# Patient Record
Sex: Female | Born: 1995 | Race: Black or African American | Hispanic: No | Marital: Single | Smoking: Never smoker
Health system: Southern US, Community
[De-identification: ages and names within clinical notes are randomized; demographics above are authoritative.]

---

## 2017-03-22 ENCOUNTER — Emergency Department (HOSPITAL_COMMUNITY)
Admission: EM | Admit: 2017-03-22 | Discharge: 2017-03-22 | Disposition: A | Discharge status: Home / Self Care | Payer: 59 | Attending: Emergency Medicine | Admitting: Emergency Medicine

## 2017-03-22 ENCOUNTER — Other Ambulatory Visit: Payer: Self-pay

## 2017-03-22 ENCOUNTER — Encounter (HOSPITAL_COMMUNITY): Payer: Self-pay | Admitting: Emergency Medicine

## 2017-03-22 DIAGNOSIS — R1084 Generalized abdominal pain: Secondary | ICD-10-CM | POA: Insufficient documentation

## 2017-03-22 DIAGNOSIS — Z5321 Procedure and treatment not carried out due to patient leaving prior to being seen by health care provider: Secondary | ICD-10-CM | POA: Diagnosis not present

## 2017-03-22 LAB — COMPREHENSIVE METABOLIC PANEL
ALK PHOS: 62 U/L (ref 38–126)
ALT: 13 U/L — AB (ref 14–54)
AST: 19 U/L (ref 15–41)
Albumin: 4 g/dL (ref 3.5–5.0)
Anion gap: 6 (ref 5–15)
BILIRUBIN TOTAL: 0.8 mg/dL (ref 0.3–1.2)
BUN: 12 mg/dL (ref 6–20)
CALCIUM: 9.6 mg/dL (ref 8.9–10.3)
CHLORIDE: 105 mmol/L (ref 101–111)
CO2: 25 mmol/L (ref 22–32)
CREATININE: 0.87 mg/dL (ref 0.44–1.00)
Glucose, Bld: 81 mg/dL (ref 65–99)
Potassium: 4.7 mmol/L (ref 3.5–5.1)
Sodium: 136 mmol/L (ref 135–145)
Total Protein: 6.9 g/dL (ref 6.5–8.1)

## 2017-03-22 LAB — CBC
HCT: 40.9 % (ref 36.0–46.0)
Hemoglobin: 14.4 g/dL (ref 12.0–15.0)
MCH: 32 pg (ref 26.0–34.0)
MCHC: 35.2 g/dL (ref 30.0–36.0)
MCV: 90.9 fL (ref 78.0–100.0)
PLATELETS: 293 10*3/uL (ref 150–400)
RBC: 4.5 MIL/uL (ref 3.87–5.11)
RDW: 13.1 % (ref 11.5–15.5)
WBC: 5.2 10*3/uL (ref 4.0–10.5)

## 2017-03-22 LAB — URINALYSIS, ROUTINE W REFLEX MICROSCOPIC
Bilirubin Urine: NEGATIVE
GLUCOSE, UA: NEGATIVE mg/dL
Hgb urine dipstick: NEGATIVE
Ketones, ur: 5 mg/dL — AB
LEUKOCYTES UA: NEGATIVE
Nitrite: POSITIVE — AB
PH: 7 (ref 5.0–8.0)
Protein, ur: NEGATIVE mg/dL
SPECIFIC GRAVITY, URINE: 1.017 (ref 1.005–1.030)

## 2017-03-22 LAB — LIPASE, BLOOD: LIPASE: 30 U/L (ref 11–51)

## 2017-03-22 LAB — I-STAT BETA HCG BLOOD, ED (MC, WL, AP ONLY): I-stat hCG, quantitative: <5 m[IU]/mL (ref <5)

## 2017-03-22 NOTE — ED Triage Notes (Signed)
Pt to ER for evaluation of persistent generalized abdominal pain with loss of appetite and weight loss, states has lost "3 jean sizes." states this has been going on for months. Reports night sweats. VSS. NAD.

## 2017-04-29 ENCOUNTER — Encounter (HOSPITAL_COMMUNITY): Payer: Self-pay | Admitting: Emergency Medicine

## 2017-04-29 DIAGNOSIS — R1012 Left upper quadrant pain: Secondary | ICD-10-CM | POA: Diagnosis not present

## 2017-04-29 DIAGNOSIS — K5900 Constipation, unspecified: Secondary | ICD-10-CM | POA: Diagnosis not present

## 2017-04-29 LAB — COMPREHENSIVE METABOLIC PANEL
ALBUMIN: 3.8 g/dL (ref 3.5–5.0)
ALK PHOS: 61 U/L (ref 38–126)
ALT: 38 U/L (ref 14–54)
ANION GAP: 10 (ref 5–15)
AST: 30 U/L (ref 15–41)
BUN: 11 mg/dL (ref 6–20)
CALCIUM: 9.5 mg/dL (ref 8.9–10.3)
CO2: 22 mmol/L (ref 22–32)
CREATININE: 0.82 mg/dL (ref 0.44–1.00)
Chloride: 105 mmol/L (ref 101–111)
GFR calc Af Amer: >60 mL/min (ref >60)
GFR calc non Af Amer: >60 mL/min (ref >60)
GLUCOSE: 129 mg/dL — AB (ref 65–99)
Potassium: 4.1 mmol/L (ref 3.5–5.1)
SODIUM: 137 mmol/L (ref 135–145)
Total Bilirubin: 0.6 mg/dL (ref 0.3–1.2)
Total Protein: 6.7 g/dL (ref 6.5–8.1)

## 2017-04-29 LAB — CBC
HCT: 39.7 % (ref 36.0–46.0)
HEMOGLOBIN: 13.6 g/dL (ref 12.0–15.0)
MCH: 30.1 pg (ref 26.0–34.0)
MCHC: 34.3 g/dL (ref 30.0–36.0)
MCV: 87.8 fL (ref 78.0–100.0)
Platelets: 285 10*3/uL (ref 150–400)
RBC: 4.52 MIL/uL (ref 3.87–5.11)
RDW: 12.5 % (ref 11.5–15.5)
WBC: 5.5 10*3/uL (ref 4.0–10.5)

## 2017-04-29 LAB — LIPASE, BLOOD: Lipase: 33 U/L (ref 11–51)

## 2017-04-29 LAB — I-STAT BETA HCG BLOOD, ED (MC, WL, AP ONLY)

## 2017-04-29 NOTE — ED Triage Notes (Signed)
Pt to ED c/o LUQ pain x 2 days - recent dx of mono 3 weeks ago and was told to go to ED for any abdominal pain, denies trauma or contact sports. No fevers or N/V. Tenderness to LUQ noted.

## 2017-04-30 ENCOUNTER — Emergency Department (HOSPITAL_COMMUNITY): Payer: 59

## 2017-04-30 ENCOUNTER — Emergency Department (HOSPITAL_COMMUNITY)
Admission: EM | Admit: 2017-04-30 | Discharge: 2017-04-30 | Disposition: A | Discharge status: Home / Self Care | Payer: 59 | Attending: Emergency Medicine | Admitting: Emergency Medicine

## 2017-04-30 DIAGNOSIS — K5900 Constipation, unspecified: Secondary | ICD-10-CM

## 2017-04-30 DIAGNOSIS — R1012 Left upper quadrant pain: Secondary | ICD-10-CM

## 2017-04-30 LAB — URINALYSIS, ROUTINE W REFLEX MICROSCOPIC
BILIRUBIN URINE: NEGATIVE
Glucose, UA: NEGATIVE mg/dL
HGB URINE DIPSTICK: NEGATIVE
KETONES UR: NEGATIVE mg/dL
Leukocytes, UA: NEGATIVE
NITRITE: NEGATIVE
Protein, ur: NEGATIVE mg/dL
Specific Gravity, Urine: 1.021 (ref 1.005–1.030)
pH: 7 (ref 5.0–8.0)

## 2017-04-30 MED ORDER — IOPAMIDOL (ISOVUE-300) INJECTION 61%
INTRAVENOUS | Status: AC
  Administered 2017-04-30: 100 mL
  Filled 2017-04-30: qty 100

## 2017-04-30 MED ORDER — IOPAMIDOL (ISOVUE-300) INJECTION 61%
INTRAVENOUS | Status: AC
  Filled 2017-04-30: qty 100

## 2017-04-30 NOTE — ED Notes (Signed)
Patient transported to CT 

## 2017-04-30 NOTE — Discharge Instructions (Signed)
Your CT scan shows your spleen is fine, it is not enlarged and is functioning fine. You do have a lot of stool in your colon which may be causes your pain. Get miralax and put one dose or 17 g in 8 ounces of water,  take 1 dose every 30 minutes for 2-3 hours or until you  get good results and then once or twice daily to prevent constipation.

## 2017-04-30 NOTE — ED Provider Notes (Signed)
[facility] Provider Note   CSN: [id number] Arrival date & time: 04/29/17  1744  Time seen 2:25 AM   History   Chief Complaint Chief Complaint  Patient presents with  . Abdominal Pain    HPI Ann Tucker is a 22 y.o. female.  HPI patient states she was diagnosed with mononucleosis about 3 weeks ago.  At that time she had a sore throat, swollen lymph nodes in her neck, fatigue and nausea.  She states her nausea has improved however she still gets tired at times.  She started getting left upper quadrant pain On Friday, February 16.  She describes it as sharp in it hurts if she presses on her abdomen and it will last a few minutes.  But sometimes it comes even without her pressing on her abdomen.  She denies any fever, vomiting, and has had some diarrhea off and on.  She also states her abdominal pain has gotten worse while she was waiting in the ED waiting room which was many hours.  PCP none   History reviewed. No pertinent past medical history.  There are no active problems to display for this patient.   History reviewed. No pertinent surgical history.  OB History    No data available       Home Medications    Prior to Admission medications   Not on File    Family History No family history on file.  Social History Social History   Tobacco Use  . Smoking status: Never Smoker  . Smokeless tobacco: Never Used  Substance Use Topics  . Alcohol use: Yes    Frequency: Never    Comment: occ  . Drug use: No  college student at [employer]   Allergies   Patient has no known allergies.   Review of Systems Review of Systems  All other systems reviewed and are negative.    Physical Exam Updated Vital Signs BP (!) 132/98 (BP Location: Right Arm)   Pulse 80   Temp 99.2 F (37.3 C) (Oral)   Resp 16   Ht 5\' 2"  (1.575 m)   Wt 61.2 kg (135 lb)   LMP 04/15/2017 (Exact Date)   SpO2 100%   BMI 24.69 kg/m    Vital signs normal    Physical Exam  Constitutional: She is oriented to person, place, and time. She appears well-developed and well-nourished.  Non-toxic appearance. She does not appear ill. No distress.  HENT:  Head: Normocephalic and atraumatic.  Right Ear: External ear normal.  Left Ear: External ear normal.  Nose: Nose normal. No mucosal edema or rhinorrhea.  Mouth/Throat: Oropharynx is clear and moist and mucous membranes are normal. No dental abscesses or uvula swelling.  Eyes: Conjunctivae and EOM are normal. Pupils are equal, round, and reactive to light.  Neck: Normal range of motion and full passive range of motion without pain. Neck supple.  Cardiovascular: Normal rate, regular rhythm and normal heart sounds. Exam reveals no gallop and no friction rub.  No murmur heard. Pulmonary/Chest: Effort normal and breath sounds normal. No respiratory distress. She has no wheezes. She has no rhonchi. She has no rales. She exhibits no tenderness and no crepitus.  Abdominal: Soft. Normal appearance and bowel sounds are normal. She exhibits no distension and no mass. There is tenderness in the left upper quadrant. There is no rebound and no guarding.    Musculoskeletal: Normal range of motion. She exhibits no edema or tenderness.  Moves  all extremities well.   Lymphadenopathy:    She has cervical adenopathy.  She has small cervical lymphadenopathy mainly on the left  Neurological: She is alert and oriented to person, place, and time. She has normal strength. No cranial nerve deficit.  Skin: Skin is warm, dry and intact. No rash noted. No erythema. No pallor.  Psychiatric: She has a normal mood and affect. Her speech is normal and behavior is normal. Her mood appears not anxious.  Nursing note and vitals reviewed.    ED Treatments / Results  Labs (all labs ordered are listed, but only abnormal results are displayed) Results for orders placed or performed during the hospital  encounter of 04/30/17  Lipase, blood  Result Value Ref Range   Lipase 33 11 - 51 U/L  Comprehensive metabolic panel  Result Value Ref Range   Sodium 137 135 - 145 mmol/L   Potassium 4.1 3.5 - 5.1 mmol/L   Chloride 105 101 - 111 mmol/L   CO2 22 22 - 32 mmol/L   Glucose, Bld 129 (H) 65 - 99 mg/dL   BUN 11 6 - 20 mg/dL   Creatinine, Ser 6.57 0.44 - 1.00 mg/dL   Calcium 9.5 8.9 - 84.6 mg/dL   Total Protein 6.7 6.5 - 8.1 g/dL   Albumin 3.8 3.5 - 5.0 g/dL   AST 30 15 - 41 U/L   ALT 38 14 - 54 U/L   Alkaline Phosphatase 61 38 - 126 U/L   Total Bilirubin 0.6 0.3 - 1.2 mg/dL   GFR calc non Af Amer >60 >60 mL/min   GFR calc Af Amer >60 >60 mL/min   Anion gap 10 5 - 15  CBC  Result Value Ref Range   WBC 5.5 4.0 - 10.5 K/uL   RBC 4.52 3.87 - 5.11 MIL/uL   Hemoglobin 13.6 12.0 - 15.0 g/dL   HCT 96.2 95.2 - 84.1 %   MCV 87.8 78.0 - 100.0 fL   MCH 30.1 26.0 - 34.0 pg   MCHC 34.3 30.0 - 36.0 g/dL   RDW 32.4 40.1 - 02.7 %   Platelets 285 150 - 400 K/uL  Urinalysis, Routine w reflex microscopic  Result Value Ref Range   Color, Urine YELLOW YELLOW   APPearance CLEAR CLEAR   Specific Gravity, Urine 1.021 1.005 - 1.030   pH 7.0 5.0 - 8.0   Glucose, UA NEGATIVE NEGATIVE mg/dL   Hgb urine dipstick NEGATIVE NEGATIVE   Bilirubin Urine NEGATIVE NEGATIVE   Ketones, ur NEGATIVE NEGATIVE mg/dL   Protein, ur NEGATIVE NEGATIVE mg/dL   Nitrite NEGATIVE NEGATIVE   Leukocytes, UA NEGATIVE NEGATIVE  I-Stat beta hCG blood, ED  Result Value Ref Range   I-stat hCG, quantitative <5.0 <5 mIU/mL   Comment 3           Laboratory interpretation all normal     EKG  EKG Interpretation None       Radiology Ct Abdomen Pelvis W Contrast  Result Date: 04/30/2017 CLINICAL DATA:  Left upper quadrant abdominal pain for 2 days. Diagnosed with mononucleosis 3 weeks ago. EXAM: CT ABDOMEN AND PELVIS WITH CONTRAST TECHNIQUE: Multidetector CT imaging of the abdomen and pelvis was performed using the standard  protocol following bolus administration of intravenous contrast. CONTRAST:  ISOVUE-300 IOPAMIDOL (ISOVUE-300) INJECTION 61% COMPARISON:  None. FINDINGS: Lower chest: Lung bases are clear. Hepatobiliary: No focal liver abnormality is seen. No gallstones, gallbladder wall thickening, or biliary dilatation. Pancreas: No ductal dilatation or inflammation. Spleen: Normal  in size measuring 10.3 x 3.1 x 8.4 cm. No focal abnormality. No perisplenic fluid. Adrenals/Urinary Tract: Adrenal glands are unremarkable. Kidneys are normal, without renal calculi, focal lesion, or hydronephrosis. Bladder is completely nondistended and not well evaluated. Stomach/Bowel: Lack of enteric contrast and paucity of intra-abdominal fat limits bowel assessment. Stomach physiologically distended. No bowel wall thickening, inflammatory change or obstruction. Moderate stool in the colon. Mild fecalization of distal small bowel contents. Appendix not visualized, no pericecal inflammation. Vascular/Lymphatic: No significant vascular findings are present. No enlarged abdominal or pelvic lymph nodes. Reproductive: Collapsed corpus luteal cyst in the left ovary with small amount of adjacent complex fluid. Uterus and right adnexa are unremarkable. Other: No free air.  No upper abdominal free fluid. Musculoskeletal: There are no acute or suspicious osseous abnormalities. IMPRESSION: 1. Normal size and appearance of the spleen. 2. Moderate colonic stool burden with fecalization of distal small bowel contents, as can be seen with slow transit/constipation. 3. Complex free fluid in the left adnexa likely secondary to a ruptured corpus luteal cyst. Electronically Signed   By: Rubye Oaks M.D.   On: 04/30/2017 03:24    Procedures Procedures (including critical care time)  Medications Ordered in ED Medications  iopamidol (ISOVUE-300) 61 % injection (100 mLs  Contrast Given 04/30/17 0304)     Initial Impression / Assessment and Plan / ED  Course  I have reviewed the triage vital signs and the nursing notes.  Pertinent labs & imaging results that were available during my care of the patient were reviewed by me and considered in my medical decision making (see chart for details).    CT of the abdomen/pelvis with contrast was ordered to assess her spleen.  We discussed that with mononucleosis there can be splenomegaly, the CT will show if she has necrosis, acute hemorrhage or bleeding is less likely because patient does not appear that toxic.  3:35 AM patient was given the results of her CT scan, she states her pain is always been in the upper abdomen and did not start in the left lower pelvis.  Therefore think her pain may be from constipation.  We discussed her spleen looked fine and she probably just needs to get through the fatigue of her mononucleosis now.  She can take MiraLAX over-the-counter for her constipation.  Final Clinical Impressions(s) / ED Diagnoses   Final diagnoses:  LUQ pain  Constipation, unspecified constipation type    ED Discharge Orders    None    OTC miralax  Plan discharge  Devoria Albe, MD, Concha Pyo, MD 04/30/17 [phone]

## 2017-04-30 NOTE — ED Notes (Signed)
Signature pad not available to pt at time of discharge. Pt provided school excuse note. Pt provided discharge instructions. Pt denied any further needs/questions. Pt verbalized understanding of discharge instructions.

## 2018-03-08 IMAGING — CT CT ABD-PELV W/ CM
2 of 4 series · 15 of 46 positions shown, 17 images · IV contrast (Omni 300)
Comparison: None.

CLINICAL DATA: Left upper quadrant abdominal pain for 2 days.
Diagnosed with mononucleosis 3 weeks ago..

EXAM:
CT ABDOMEN AND PELVIS WITH CONTRAST
TECHNIQUE: Multidetector CT imaging of the abdomen and pelvis was performed
using the standard protocol following bolus administration of
intravenous contrast.
CONTRAST:  100mL G5LZAB-N33 IOPAMIDOL (G5LZAB-N33) INJECTION 61%

[Series 3: a/p w/ 5mm · axial · 0.79mm/px · z∈[+715,+1055]mm · 12 of 82 slices shown, 14 images]
[im 7/82  soft-tissue]
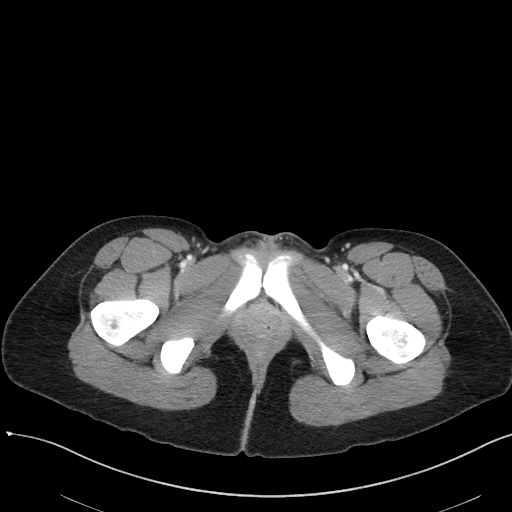
[im 7/82  bone]
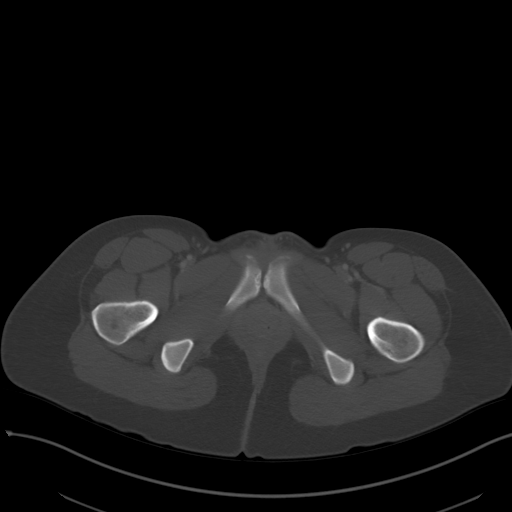
[im 13/82  soft-tissue]
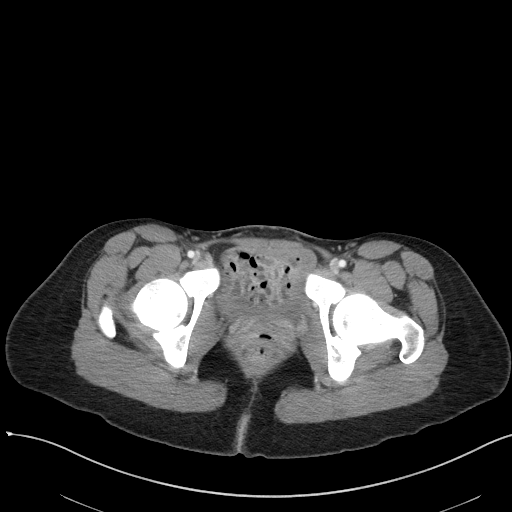
[im 19/82  soft-tissue]
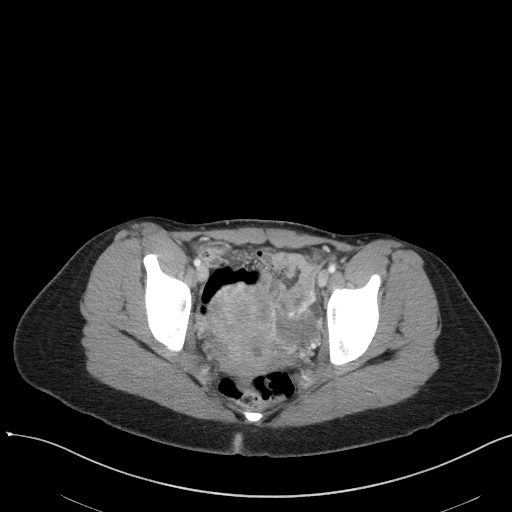
[im 25/82  soft-tissue]
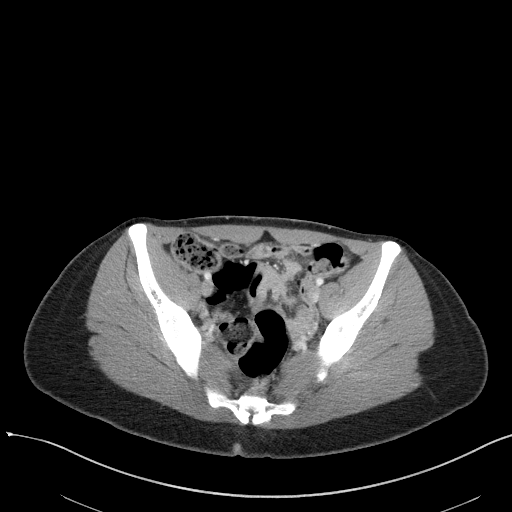
[im 32/82  soft-tissue]
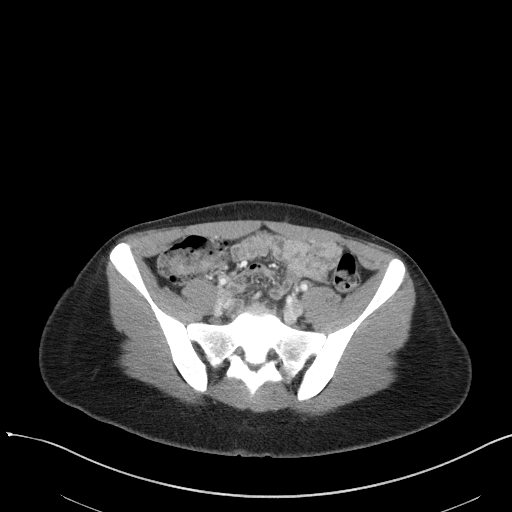
[im 38/82  soft-tissue]
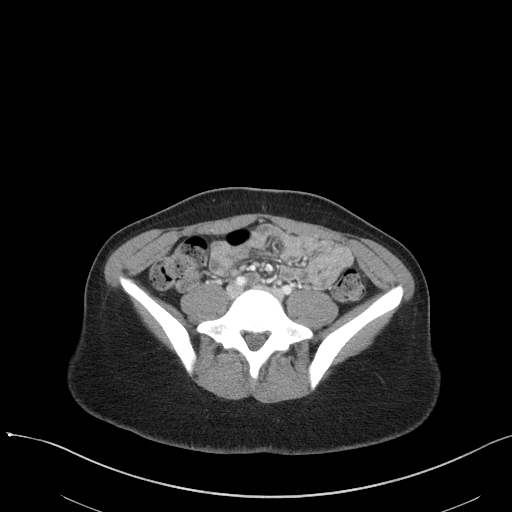
[im 44/82  soft-tissue]
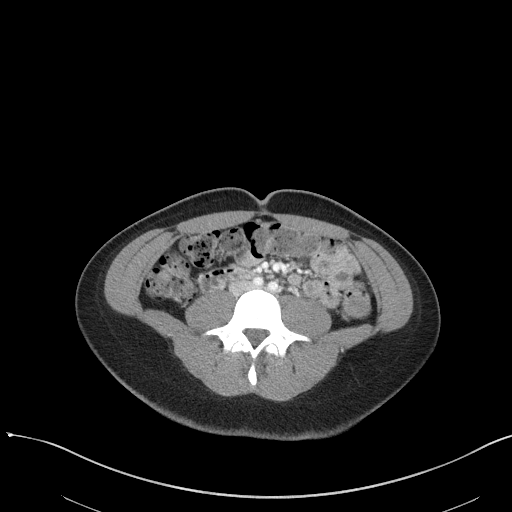
[im 50/82  soft-tissue]
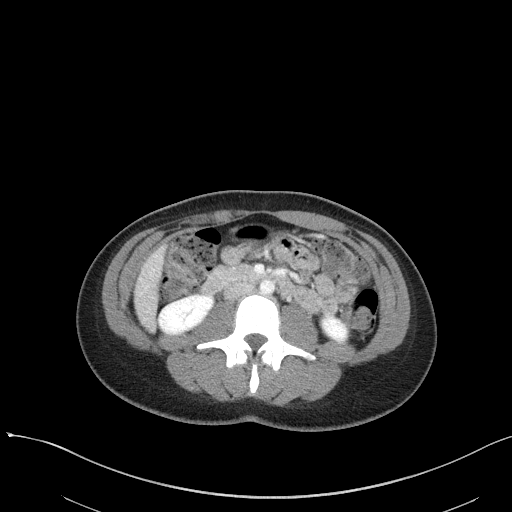
[im 57/82  soft-tissue]
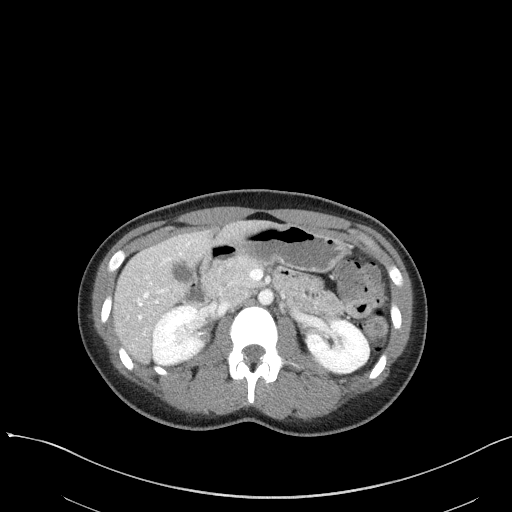
[im 57/82  bone]
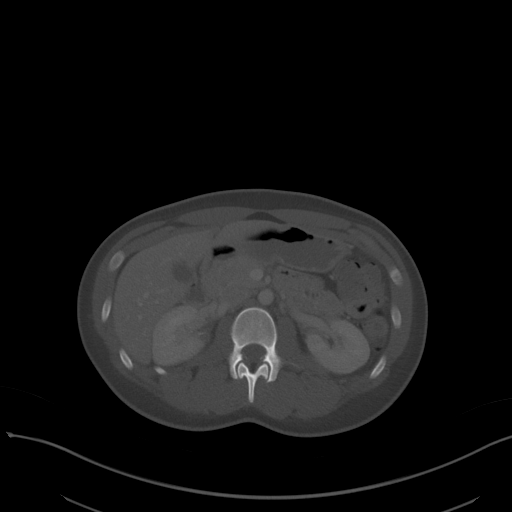
[im 63/82  soft-tissue]
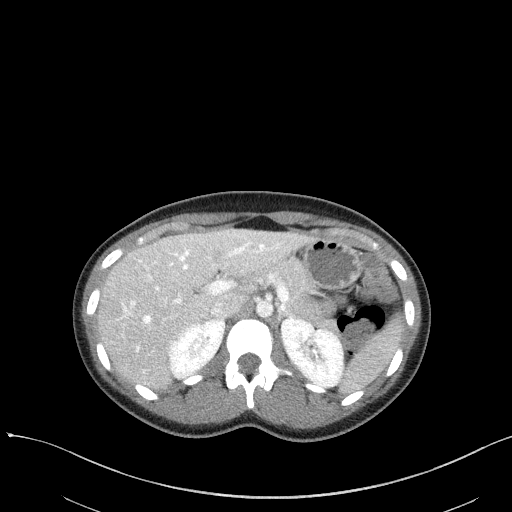
[im 69/82  soft-tissue]
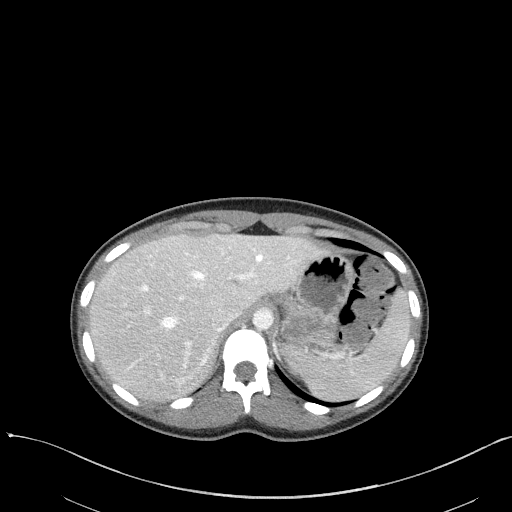
[im 75/82  soft-tissue]
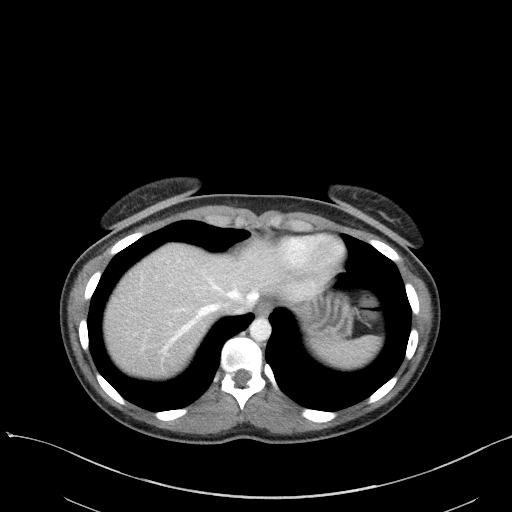

[Series 6: a/p w/ cor · coronal · 0.58mm/px · 3 of 105 slices shown]
[im 35/105  soft-tissue]
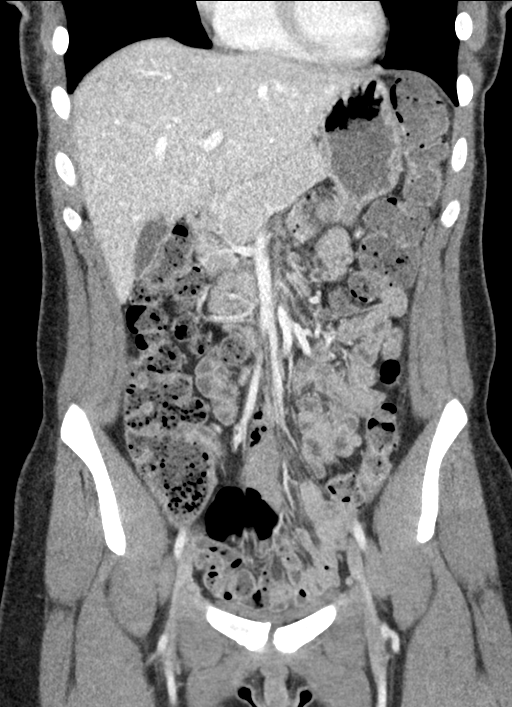
[im 47/105  soft-tissue]
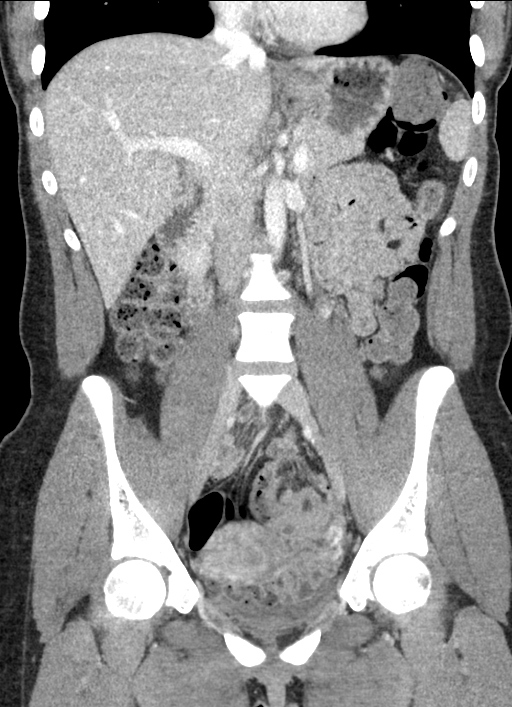
[im 58/105  soft-tissue]
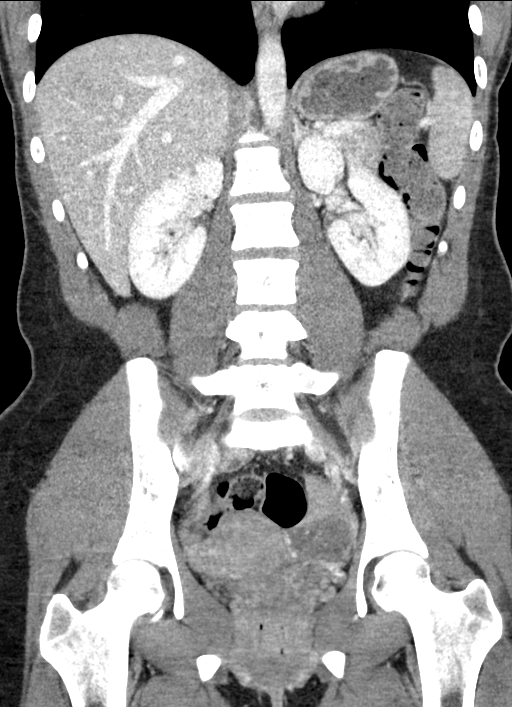

[15 of 46 positions shown; findings below may reference images not displayed]

FINDINGS: Lower chest: Lung bases are clear.

Hepatobiliary: No focal liver abnormality is seen. No gallstones,
gallbladder wall thickening, or biliary dilatation.

Pancreas: No ductal dilatation or inflammation.

Spleen: Normal in size measuring 10.3 x 3.1 x 8.4 cm. No focal
abnormality. No perisplenic fluid.

Adrenals/Urinary Tract: Adrenal glands are unremarkable. Kidneys are
normal, without renal calculi, focal lesion, or hydronephrosis.
Bladder is completely nondistended and not well evaluated.

Stomach/Bowel: Lack of enteric contrast and paucity of
intra-abdominal fat limits bowel assessment. Stomach physiologically
distended. No bowel wall thickening, inflammatory change or
obstruction. Moderate stool in the colon. Mild fecalization of
distal small bowel contents. Appendix not visualized, no pericecal
inflammation.

Vascular/Lymphatic: No significant vascular findings are present. No
enlarged abdominal or pelvic lymph nodes.

Reproductive: Collapsed corpus luteal cyst in the left ovary with
small amount of adjacent complex fluid. Uterus and right adnexa are
unremarkable.

Other: No free air.  No upper abdominal free fluid.

Musculoskeletal: There are no acute or suspicious osseous
abnormalities.
IMPRESSION: 1. Normal size and appearance of the spleen.
2. Moderate colonic stool burden with fecalization of distal small
bowel contents, as can be seen with slow transit/constipation.
3. Complex free fluid in the left adnexa likely secondary to a
ruptured corpus luteal cyst.
